# Patient Record
Sex: Female | Born: 1960 | Race: Black or African American | Hispanic: No | Marital: Single | Smoking: Current every day smoker
Health system: Southern US, Community
[De-identification: ages and names within clinical notes are randomized; demographics above are authoritative.]

## PROBLEM LIST (undated history)

## (undated) HISTORY — PX: ABDOMINAL HYSTERECTOMY: SHX81

---

## 2014-10-31 ENCOUNTER — Emergency Department (HOSPITAL_BASED_OUTPATIENT_CLINIC_OR_DEPARTMENT_OTHER)
Admission: EM | Admit: 2014-10-31 | Discharge: 2014-10-31 | Disposition: A | Discharge status: Home / Self Care | Payer: BLUE CROSS/BLUE SHIELD | Attending: Emergency Medicine | Admitting: Emergency Medicine

## 2014-10-31 ENCOUNTER — Encounter (HOSPITAL_BASED_OUTPATIENT_CLINIC_OR_DEPARTMENT_OTHER): Payer: Self-pay

## 2014-10-31 DIAGNOSIS — L03113 Cellulitis of right upper limb: Secondary | ICD-10-CM | POA: Diagnosis not present

## 2014-10-31 DIAGNOSIS — Z720 Tobacco use: Secondary | ICD-10-CM | POA: Diagnosis not present

## 2014-10-31 DIAGNOSIS — Z23 Encounter for immunization: Secondary | ICD-10-CM | POA: Insufficient documentation

## 2014-10-31 DIAGNOSIS — S40861A Insect bite (nonvenomous) of right upper arm, initial encounter: Secondary | ICD-10-CM | POA: Diagnosis not present

## 2014-10-31 DIAGNOSIS — Y92129 Unspecified place in nursing home as the place of occurrence of the external cause: Secondary | ICD-10-CM | POA: Insufficient documentation

## 2014-10-31 DIAGNOSIS — Y939 Activity, unspecified: Secondary | ICD-10-CM | POA: Diagnosis not present

## 2014-10-31 DIAGNOSIS — W57XXXA Bitten or stung by nonvenomous insect and other nonvenomous arthropods, initial encounter: Secondary | ICD-10-CM | POA: Diagnosis not present

## 2014-10-31 DIAGNOSIS — Y999 Unspecified external cause status: Secondary | ICD-10-CM | POA: Diagnosis not present

## 2014-10-31 MED ORDER — IBUPROFEN 800 MG PO TABS
800.0000 mg | ORAL_TABLET | Freq: Once | ORAL | Status: AC
  Administered 2014-10-31: 800 mg via ORAL
  Filled 2014-10-31: qty 1

## 2014-10-31 MED ORDER — CLINDAMYCIN HCL 150 MG PO CAPS
300.0000 mg | ORAL_CAPSULE | Freq: Once | ORAL | Status: AC
  Administered 2014-10-31: 300 mg via ORAL
  Filled 2014-10-31: qty 2

## 2014-10-31 MED ORDER — HYDROCODONE-ACETAMINOPHEN 5-325 MG PO TABS
2.0000 | ORAL_TABLET | Freq: Once | ORAL | Status: AC
  Administered 2014-10-31: 2 via ORAL
  Filled 2014-10-31: qty 2

## 2014-10-31 MED ORDER — TETANUS-DIPHTH-ACELL PERTUSSIS 5-2.5-18.5 LF-MCG/0.5 IM SUSP
0.5000 mL | Freq: Once | INTRAMUSCULAR | Status: AC
  Administered 2014-10-31: 0.5 mL via INTRAMUSCULAR
  Filled 2014-10-31: qty 0.5

## 2014-10-31 MED ORDER — IBUPROFEN 600 MG PO TABS: 600.0000 mg | ORAL_TABLET | Freq: Four times a day (QID) | ORAL | Status: AC | PRN

## 2014-10-31 MED ORDER — HYDROCODONE-ACETAMINOPHEN 5-325 MG PO TABS: 1.0000 | ORAL_TABLET | Freq: Four times a day (QID) | ORAL | Status: AC | PRN

## 2014-10-31 MED ORDER — CLINDAMYCIN HCL 300 MG PO CAPS: 300.0000 mg | ORAL_CAPSULE | Freq: Three times a day (TID) | ORAL | Status: AC

## 2014-10-31 NOTE — ED Provider Notes (Signed)
CSN: [id number]     Arrival date & time 10/31/14  1802 History  This chart was scribed for Richardean Canal, MD by Lyndel Safe, ED Scribe. This patient was seen in room MH03/MH03 and the patient's care was started 6:46 PM.   Chief Complaint  Patient presents with  . Arm Swelling   HPI HPI Comments: Alexis Oconnor is a 54 y.o. female who presents to the Emergency Department complaining of a progressively worsening area of constant, moderate pain, swelling, redness, and warmth to upper right arm s/p insect bite 1 day ago. 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 The pt states the area started as a pruritic area but has since worsened with increased swelling, redness, and warmth. She denies the affected area to be attributable to a needle stick. Denies fevers.    History reviewed. No pertinent past medical history. Past Surgical History  Procedure Laterality Date  . Cesarean section    . Abdominal hysterectomy     No family history on file. History  Substance Use Topics  . Smoking status: Current Every Day Smoker    Types: Cigarettes  . Smokeless tobacco: Not on file  . Alcohol Use: No   OB History    No data available     Review of Systems  Constitutional: Negative for fever.  Musculoskeletal: Positive for myalgias.  Skin: Positive for color change ( right upper arm; redness).  All other systems reviewed and are negative.  Allergies  Review of patient's allergies indicates no known allergies.  Home Medications   Prior to Admission medications   Not on File   BP 145/68 mmHg  Pulse 92  Temp(Src) 98.2 F (36.8 C) (Oral)  Resp 16  Ht  (1.575 m)  Wt 148 lb (67.132 kg)  BMI 27.06 kg/m2  SpO2 97% Physical Exam  Constitutional: She appears well-developed and well-nourished. No distress.  HENT:  Head: Normocephalic.  Eyes: Conjunctivae are normal. Right eye exhibits no discharge. Left eye  exhibits no discharge. No scleral icterus.  Neck: No JVD present.  Pulmonary/Chest: Effort normal. No respiratory distress.  Musculoskeletal: She exhibits edema.  Right bicep area with an area of cellulitis but no fluctuance. There are no swollen lymph nodes to the antecubital area. 2+ radial pulses.   Neurological: She is alert. Coordination normal.  Skin: Skin is warm. No rash noted. No erythema. No pallor.  Psychiatric: She has a normal mood and affect. Her behavior is normal.  Nursing note and vitals reviewed.   ED Course  Procedures  DIAGNOSTIC STUDIES: Oxygen Saturation is 97% on RA, normal by my interpretation.    COORDINATION OF CARE: 6:49 PM Discussed treatment plan which includes to order ibuprofen, pain medication, and clindamycin with pt. Pt acknowledges and agrees to plan.   Labs Review Labs Reviewed - No data to display  Imaging Review No results found.   EKG Interpretation None      MDM   Final diagnoses:  None   Alexis Oconnor is a 54 y.o. female here with possible bug bite. There is evidence of cellulitis, no abscess. Its about 4 inch in diameter. No lymphadenopathy. Afebrile, well appearing. Will dc with clinda, vicodin for pain. Gave strict return precautions.   I personally performed the services described in this documentation, which was scribed in my presence. The recorded information has been reviewed and is accurate.    Richardean Canal,  MD 10/31/14 1910

## 2014-10-31 NOTE — ED Notes (Signed)
Redness, swelling to right arm x today

## 2014-10-31 NOTE — ED Notes (Addendum)
Pt bit on Right arm by something at work last night. Patient RUE swollen, red, warm to touch. Denies any fever, n/v. Strong radial pulse

## 2014-10-31 NOTE — Discharge Instructions (Signed)
Take motrin for pain.   Take vicodin for severe pain. Do NOT drive with it.   Apply ice to area.   Take clinda three times daily for a week.   Follow up with your doctor.   Return to ER if you have severe pain, worse arm swelling, fever, purulent drainage from the wound.

## 2014-11-06 ENCOUNTER — Emergency Department (HOSPITAL_BASED_OUTPATIENT_CLINIC_OR_DEPARTMENT_OTHER)
Admission: EM | Admit: 2014-11-06 | Discharge: 2014-11-06 | Disposition: A | Discharge status: Home / Self Care | Payer: BLUE CROSS/BLUE SHIELD | Attending: Emergency Medicine | Admitting: Emergency Medicine

## 2014-11-06 ENCOUNTER — Encounter (HOSPITAL_BASED_OUTPATIENT_CLINIC_OR_DEPARTMENT_OTHER): Payer: Self-pay | Admitting: *Deleted

## 2014-11-06 DIAGNOSIS — Z720 Tobacco use: Secondary | ICD-10-CM | POA: Insufficient documentation

## 2014-11-06 DIAGNOSIS — M25511 Pain in right shoulder: Secondary | ICD-10-CM | POA: Insufficient documentation

## 2014-11-06 MED ORDER — IBUPROFEN 400 MG PO TABS
400.0000 mg | ORAL_TABLET | Freq: Once | ORAL | Status: AC
  Administered 2014-11-06: 400 mg via ORAL
  Filled 2014-11-06: qty 1

## 2014-11-06 MED ORDER — METHOCARBAMOL 500 MG PO TABS: 500.0000 mg | ORAL_TABLET | Freq: Two times a day (BID) | ORAL | Status: AC

## 2014-11-06 NOTE — ED Notes (Signed)
Presents with rt arm pain and tenderness after rec Tetanus injection

## 2014-11-06 NOTE — ED Notes (Signed)
MD at bedside. 

## 2014-11-06 NOTE — Discharge Instructions (Signed)
Continue taking the Motrin as directed. Use the Robaxin as directed as well 2. Return here for fever, arm swelling, and tingling, or any other problems

## 2014-11-06 NOTE — ED Notes (Signed)
Pt state was here, this past Monday, rec injection in rt arm, tetanus injection secondary to a bite

## 2014-11-06 NOTE — ED Provider Notes (Signed)
CSN: [id number]     Arrival date & time 11/06/14  1026 History   First MD Initiated Contact with Patient 11/06/14 1035     Chief Complaint  Patient presents with  . Arm Pain     (Consider location/radiation/quality/duration/timing/severity/associated sxs/prior Treatment) HPI Comments: Patient here complaining of right shoulder pain at the injection site where she had a tetanus update recently. Denies any fever or chills at home. Pain characterized as mild and worse with movement. Denies any numbness or tingling to her right hand. No recent history of trauma. She is right-handed. Pain is worse with movement and better with remaining still. No treatment use prior to arrival  Patient is a 54 y.o. female presenting with arm pain. The history is provided by the patient.  Arm Pain    History reviewed. No pertinent past medical history. Past Surgical History  Procedure Laterality Date  . Cesarean section    . Abdominal hysterectomy     No family history on file. Social History  Substance Use Topics  . Smoking status: Current Every Day Smoker    Types: Cigarettes  . Smokeless tobacco: None  . Alcohol Use: No   OB History    No data available     Review of Systems  All other systems reviewed and are negative.     Allergies  Review of patient's allergies indicates no known allergies.  Home Medications   Prior to Admission medications   Medication Sig Start Date End Date Taking? Authorizing Provider  clindamycin (CLEOCIN) 300 MG capsule Take 1 capsule (300 mg total) by mouth 3 (three) times daily. X 7 days 10/31/14   Richardean Canal, MD  HYDROcodone-acetaminophen (NORCO/VICODIN) 5-325 MG per tablet Take 1-2 tablets by mouth every 6 (six) hours as needed. 10/31/14   Richardean Canal, MD  ibuprofen (ADVIL,MOTRIN) 600 MG tablet Take 1 tablet (600 mg total) by mouth every 6 (six) hours as needed. 10/31/14   Richardean Canal, MD   BP 136/61 mmHg  Pulse 100  Temp(Src) 99.5 F (37.5 C) (Oral)  Resp  20  SpO2 96% Physical Exam  Constitutional: She is oriented to person, place, and time. She appears well-developed and well-nourished.  Non-toxic appearance. No distress.  HENT:  Head: Normocephalic and atraumatic.  Eyes: Conjunctivae, EOM and lids are normal. Pupils are equal, round, and reactive to light.  Neck: Normal range of motion. Neck supple. No tracheal deviation present. No thyroid mass present.  Cardiovascular: Normal rate, regular rhythm and normal heart sounds.  Exam reveals no gallop.   No murmur heard. Pulmonary/Chest: Effort normal and breath sounds normal. No stridor. No respiratory distress. She has no decreased breath sounds. She has no wheezes. She has no rhonchi. She has no rales.  Abdominal: Soft. Normal appearance and bowel sounds are normal. She exhibits no distension. There is no tenderness. There is no rebound and no CVA tenderness.  Musculoskeletal: Normal range of motion. She exhibits no edema or tenderness.       Arms: Neurological: She is alert and oriented to person, place, and time. She has normal strength. No cranial nerve deficit or sensory deficit. GCS eye subscore is 4. GCS verbal subscore is 5. GCS motor subscore is 6.  Skin: Skin is warm and dry. No abrasion and no rash noted.  Psychiatric: She has a normal mood and affect. Her speech is normal and behavior is normal.  Nursing note and vitals reviewed.   ED Course  Procedures (including critical care time)  Labs Review Labs Reviewed - No data to display  Imaging Review No results found. I, Toy Baker, personally reviewed and evaluated these images and lab results as part of my medical decision-making.   EKG Interpretation None      MDM   Final diagnoses:  None    No evidence of infection outpatient shoulder. Suspect this is a reaction from being immunized. Given Motrin here return precautions given    Lorre Nick, MD 11/06/14 1045

## 2014-12-27 ENCOUNTER — Encounter (HOSPITAL_BASED_OUTPATIENT_CLINIC_OR_DEPARTMENT_OTHER): Payer: Self-pay

## 2014-12-27 ENCOUNTER — Emergency Department (HOSPITAL_BASED_OUTPATIENT_CLINIC_OR_DEPARTMENT_OTHER)
Admission: EM | Admit: 2014-12-27 | Discharge: 2014-12-27 | Disposition: A | Discharge status: Home / Self Care | Payer: BLUE CROSS/BLUE SHIELD | Attending: Emergency Medicine | Admitting: Emergency Medicine

## 2014-12-27 ENCOUNTER — Emergency Department (HOSPITAL_BASED_OUTPATIENT_CLINIC_OR_DEPARTMENT_OTHER): Payer: BLUE CROSS/BLUE SHIELD

## 2014-12-27 DIAGNOSIS — N390 Urinary tract infection, site not specified: Secondary | ICD-10-CM | POA: Diagnosis not present

## 2014-12-27 DIAGNOSIS — G43809 Other migraine, not intractable, without status migrainosus: Secondary | ICD-10-CM | POA: Diagnosis not present

## 2014-12-27 DIAGNOSIS — Z79899 Other long term (current) drug therapy: Secondary | ICD-10-CM | POA: Diagnosis not present

## 2014-12-27 DIAGNOSIS — Z720 Tobacco use: Secondary | ICD-10-CM | POA: Insufficient documentation

## 2014-12-27 DIAGNOSIS — M542 Cervicalgia: Secondary | ICD-10-CM | POA: Insufficient documentation

## 2014-12-27 DIAGNOSIS — M549 Dorsalgia, unspecified: Secondary | ICD-10-CM | POA: Diagnosis present

## 2014-12-27 LAB — URINE MICROSCOPIC-ADD ON

## 2014-12-27 LAB — URINALYSIS, ROUTINE W REFLEX MICROSCOPIC
Bilirubin Urine: NEGATIVE
Glucose, UA: NEGATIVE mg/dL
HGB URINE DIPSTICK: NEGATIVE
KETONES UR: NEGATIVE mg/dL
Nitrite: NEGATIVE
PROTEIN: NEGATIVE mg/dL
Specific Gravity, Urine: 1.018 (ref 1.005–1.030)
Urobilinogen, UA: 1 mg/dL (ref 0.0–1.0)
pH: 5.5 (ref 5.0–8.0)

## 2014-12-27 MED ORDER — FOSFOMYCIN TROMETHAMINE 3 G PO PACK
3.0000 g | PACK | Freq: Once | ORAL | Status: AC
  Administered 2014-12-27: 3 g via ORAL
  Filled 2014-12-27: qty 3

## 2014-12-27 MED ORDER — SODIUM CHLORIDE 0.9 % IV BOLUS (SEPSIS)
1000.0000 mL | Freq: Once | INTRAVENOUS | Status: AC
  Administered 2014-12-27: 1000 mL via INTRAVENOUS

## 2014-12-27 MED ORDER — KETOROLAC TROMETHAMINE 30 MG/ML IJ SOLN
30.0000 mg | Freq: Once | INTRAMUSCULAR | Status: AC
  Administered 2014-12-27: 30 mg via INTRAVENOUS
  Filled 2014-12-27: qty 1

## 2014-12-27 MED ORDER — PROCHLORPERAZINE EDISYLATE 5 MG/ML IJ SOLN
10.0000 mg | Freq: Once | INTRAMUSCULAR | Status: AC
  Administered 2014-12-27: 10 mg via INTRAVENOUS
  Filled 2014-12-27: qty 2

## 2014-12-27 MED ORDER — DIPHENHYDRAMINE HCL 50 MG/ML IJ SOLN
25.0000 mg | Freq: Once | INTRAMUSCULAR | Status: AC
  Administered 2014-12-27: 25 mg via INTRAVENOUS
  Filled 2014-12-27: qty 1

## 2014-12-27 NOTE — ED Notes (Signed)
Pt c/o right side back pain that radiates down her leg and a headache since last night that is not relieved by one dose of aleve.  C/o eye pain associated with headache, denies n/v.  Denies numbness to groin, denies incontinence, denies injury.

## 2014-12-27 NOTE — ED Provider Notes (Signed)
CSN: [id number]     Arrival date & time 12/27/14  4098 History   First MD Initiated Contact with Patient 12/27/14 [phone]     Chief Complaint  Patient presents with  . Headache  . Back Pain     (Consider location/radiation/quality/duration/timing/severity/associated sxs/prior Treatment) Patient is a 54 y.o. female presenting with headaches and back pain. The history is provided by the patient.  Headache Pain location:  Occipital Quality:  Dull Radiates to:  Does not radiate Severity currently:  10/10 Severity at highest:  10/10 Onset quality:  Gradual Duration:  2 days Timing:  Constant Progression:  Worsening Chronicity:  Recurrent Similar to prior headaches: yes   Context: activity, bright light and loud noise   Relieved by:  Nothing Worsened by:  Nothing Ineffective treatments:  None tried Associated symptoms: back pain and neck pain   Associated symptoms: no abdominal pain, no congestion, no dizziness, no fever, no myalgias, no nausea, no neck stiffness and no vomiting   Back Pain Associated symptoms: dysuria   Associated symptoms: no abdominal pain, no chest pain, no fever and no headaches    54 yo F with a chief complaint of a migraine headache. Patient states is been going on since yesterday. States that she also has had generalized myalgias. He is also started yesterday. Denies cough congestion fevers chills. Has had some mild right-sided earache as well as some mild right eye injection. Mild dysuria. Patient works as a International aid/development worker. Denies any recent injuries. Complaining of pain worse in the trapezius muscles bilaterally. Headache is like prior. Slow in onset.  History reviewed. No pertinent past medical history. Past Surgical History  Procedure Laterality Date  . Cesarean section    . Abdominal hysterectomy     No family history on file. Social History  Substance Use Topics  . Smoking status: Current Every Day Smoker    Types: Cigarettes  . Smokeless tobacco:  None  . Alcohol Use: No   OB History    No data available     Review of Systems  Constitutional: Negative for fever and chills.  HENT: Negative for congestion and rhinorrhea.   Eyes: Negative for redness and visual disturbance.  Respiratory: Negative for shortness of breath and wheezing.   Cardiovascular: Negative for chest pain and palpitations.  Gastrointestinal: Negative for nausea, vomiting and abdominal pain.  Genitourinary: Positive for dysuria. Negative for urgency.  Musculoskeletal: Positive for back pain and neck pain. Negative for myalgias, arthralgias and neck stiffness.  Skin: Negative for pallor and wound.  Neurological: Negative for dizziness and headaches.      Allergies  Review of patient's allergies indicates no known allergies.  Home Medications   Prior to Admission medications   Medication Sig Start Date End Date Taking? Authorizing Provider  clindamycin (CLEOCIN) 300 MG capsule Take 1 capsule (300 mg total) by mouth 3 (three) times daily. X 7 days 10/31/14   Richardean Canal, MD  HYDROcodone-acetaminophen (NORCO/VICODIN) 5-325 MG per tablet Take 1-2 tablets by mouth every 6 (six) hours as needed. 10/31/14   Richardean Canal, MD  ibuprofen (ADVIL,MOTRIN) 600 MG tablet Take 1 tablet (600 mg total) by mouth every 6 (six) hours as needed. 10/31/14   Richardean Canal, MD  methocarbamol (ROBAXIN) 500 MG tablet Take 1 tablet (500 mg total) by mouth 2 (two) times daily. 11/06/14   Lorre Nick, MD   BP 101/80 mmHg  Pulse 76  Temp(Src) 97.9 F (36.6 C) (Oral)  Resp 18  Ht  (1.575 m)  Wt 147 lb (66.679 kg)  BMI 26.88 kg/m2  SpO2 98% Physical Exam  Constitutional: She is oriented to person, place, and time. She appears well-developed and well-nourished. No distress.  HENT:  Head: Normocephalic and atraumatic.  Eyes: EOM are normal. Pupils are equal, round, and reactive to light.  Neck: Normal range of motion. Neck supple.  Cardiovascular: Normal rate and regular rhythm.   Exam reveals no gallop and no friction rub.   No murmur heard. Pulmonary/Chest: Effort normal. She has no wheezes. She has no rales.  Abdominal: Soft. She exhibits no distension. There is no tenderness. There is no rebound and no guarding.  Musculoskeletal: She exhibits no edema or tenderness.  Neurological: She is alert and oriented to person, place, and time. She has normal strength. No cranial nerve deficit or sensory deficit. She displays a negative Romberg sign. Coordination and gait normal. GCS eye subscore is 4. GCS verbal subscore is 5. GCS motor subscore is 6. She displays no Babinski's sign on the right side. She displays no Babinski's sign on the left side.  Reflex Scores:      Tricep reflexes are 2+ on the right side and 2+ on the left side.      Bicep reflexes are 2+ on the right side and 2+ on the left side.      Brachioradialis reflexes are 2+ on the right side and 2+ on the left side.      Patellar reflexes are 2+ on the right side and 2+ on the left side.      Achilles reflexes are 2+ on the right side and 2+ on the left side. Skin: Skin is warm and dry. She is not diaphoretic.  Psychiatric: She has a normal mood and affect. Her behavior is normal.    ED Course  Procedures (including critical care time) Labs Review Labs Reviewed  URINALYSIS, ROUTINE W REFLEX MICROSCOPIC (NOT AT [facility]) - Abnormal; Notable for the following:    APPearance CLOUDY (*)    Leukocytes, UA SMALL (*)    All other components within normal limits  URINE MICROSCOPIC-ADD ON - Abnormal; Notable for the following:    Squamous Epithelial / LPF FEW (*)    Bacteria, UA MANY (*)    All other components within normal limits  URINE CULTURE    Imaging Review Dg Chest 2 View  12/27/2014   CLINICAL DATA:  Shortness of breath and congestion for 4 days  EXAM: CHEST  2 VIEW  COMPARISON:  None.  FINDINGS: Lungs are clear. The heart size and pulmonary vascularity are normal. No adenopathy. There is thoracolumbar  levoscoliosis.  IMPRESSION: No edema or consolidation.   Electronically Signed   By: Bretta Bang III M.D.   On: 12/27/2014 07:28   I have personally reviewed and evaluated these images and lab results as part of my medical decision-making.   EKG Interpretation None      MDM   Final diagnoses:  UTI (lower urinary tract infection)  Other migraine without status migrainosus, not intractable    54 yo F with a chief complaint of generalized myalgias and a migraine headache. Headache appears unchanged from prior headaches. Will treat with migraine cocktail. Patient also complaining of generalized myalgias will obtain a urine chest x-ray to rule out bacterial source of infection. Could be viral in etiology versus simple muscle strain of the trapezius. Patient unsure of any inciting event to cause muscle injury.  Headache completely relieved with  migraine cocktail. Patient feeling much better requesting discharge home. UA with possible UTI with too numerous to count bacteria. Will treat one time with fosfomycin. Sent for culture. PCP follow-up.  8:31 AM:  I have discussed the diagnosis/risks/treatment options with the patient and believe the pt to be eligible for discharge home to follow-up with PCP. We also discussed returning to the ED immediately if new or worsening sx occur. We discussed the sx which are most concerning (e.g., fevers, chills, back pain, neuro symptoms) that necessitate immediate return. Medications administered to the patient during their visit and any new prescriptions provided to the patient are listed below.  Medications given during this visit Medications  prochlorperazine (COMPAZINE) injection 10 mg (10 mg Intravenous Given 12/27/14 0735)  ketorolac (TORADOL) 30 MG/ML injection 30 mg (30 mg Intravenous Given 12/27/14 0735)  diphenhydrAMINE (BENADRYL) injection 25 mg (25 mg Intravenous Given 12/27/14 0735)  sodium chloride 0.9 % bolus 1,000 mL (1,000 mLs Intravenous New  Bag/Given 12/27/14 0735)  fosfomycin (MONUROL) packet 3 g (3 g Oral Given 12/27/14 0822)    New Prescriptions   No medications on file     The patient appears reasonably screen and/or stabilized for discharge and I doubt any other medical condition or other [facility] requiring further screening, evaluation, or treatment in the ED at this time prior to discharge.    Melene Plan, DO 12/27/14 [phone]

## 2014-12-27 NOTE — Discharge Instructions (Signed)

## 2014-12-27 NOTE — ED Notes (Signed)
Pt falls asleep easily, snoring respirations

## 2014-12-27 NOTE — ED Notes (Signed)
Pt waiting for her daughter to come pick her up to take her home

## 2014-12-28 LAB — URINE CULTURE
Culture: >=100000
Special Requests: NORMAL

## 2014-12-29 ENCOUNTER — Telehealth (HOSPITAL_BASED_OUTPATIENT_CLINIC_OR_DEPARTMENT_OTHER): Payer: Self-pay | Admitting: Emergency Medicine

## 2014-12-29 NOTE — Progress Notes (Signed)
ED Antimicrobial Stewardship Positive Culture Follow Up   Alexis Oconnor is an 54 y.o. female who presented to [facility] on 12/27/2014 with a chief complaint of  Chief Complaint  Patient presents with  . Headache  . Back Pain    Recent Results (from the past 720 hour(s))  Urine culture     Status: None   Collection Time: 12/27/14  7:45 AM  Result Value Ref Range Status   Specimen Description URINE, CLEAN CATCH  Final   Special Requests Normal  Final   Culture   Final    >=100,000 COLONIES/mL GROUP B STREP(S.AGALACTIAE)ISOLATED TESTING AGAINST S. AGALACTIAE NOT ROUTINELY PERFORMED DUE TO PREDICTABILITY OF AMP/PEN/VAN SUSCEPTIBILITY. Performed at [facility]    Report Status 12/28/2014 FINAL  Final     Treated with fosfomycin x1 in ED, no data for organism coverage with this abx  New antibiotic prescription: If patient still symptomatic, Keflex  bid x 7day  ED Provider: Donnetta Hutching, MD  Assessment: 54yoF came to the ED with complaints of HA, back pain, and dysuria. Treated with fosfomycin x 1 dose in ED. No discharged abx. UCx grew Group B strep. Coverage of organism with fosfomycin is not known. Plan to contact patient and see if still symptomatic, if so then prescribe Keflex  bid x 7day  Remi Haggard, PharmD Clinical Pharmacist- Resident Pager: [phone]  Remi Haggard 12/29/2014, 9:14 AM Infectious Diseases Pharmacist Phone# [phone]

## 2014-12-29 NOTE — Telephone Encounter (Signed)
Post ED Visit - Positive Culture Follow-up: Successful Patient Follow-Up  Culture assessed and recommendations reviewed by:  Celedonio Miyamoto, Pharm.D., BCPS-AQ ID  Georgina Pillion, [address].D., BCPS  [place], [place].D., BCPS, AAHIVP  Estella Husk, Pharm.D., BCPS, AAHIVP  [employer], [address].D.  Tennis Must, [place].D. Lisette Grinder PharmD  Positive urine culture Group B strep   Patient discharged without antimicrobial prescription and treatment is now indicated  Organism is resistant to prescribed ED discharge antimicrobial  Patient with positive blood cultures  Changes discussed with ED provider: Donnetta Hutching New antibiotic prescription Keflex  po q 12 hours x 7 days is symptomatic Called to n/a at present attemptting to reach pt Contacted patient,, left message for pt to call   Berle Mull 12/29/2014, 4:02 PM

## 2014-12-29 NOTE — Telephone Encounter (Deleted)
Post ED Visit - Positive Culture Follow-up  Culture report reviewed by antimicrobial stewardship pharmacist:   Celedonio Miyamoto, Pharm.D., BCPS  Georgina Pillion, [address].D., BCPS  [place], [place].D., BCPS, AAHIVP  Estella Husk, Pharm.D., BCPS, AAHIVP  [employer], [address].D.  Cassie Roseanne [place], [place].D. Lisette Grinder PharmD  Positive urine Culture Group B strep Treated with fosp, organism sensitive to the same and no further patient follow-up is required at this time.  Berle Mull 12/29/2014, 4:00 PM

## 2014-12-30 ENCOUNTER — Telehealth (HOSPITAL_COMMUNITY): Payer: Self-pay

## 2014-12-30 NOTE — Telephone Encounter (Signed)
Left message w/pts daughter for pt to return call.

## 2014-12-31 ENCOUNTER — Telehealth (HOSPITAL_COMMUNITY): Payer: Self-pay

## 2014-12-31 NOTE — Telephone Encounter (Signed)
Unable to reach by telephone. Letter sent to address on record.  

## 2015-01-12 ENCOUNTER — Telehealth (HOSPITAL_BASED_OUTPATIENT_CLINIC_OR_DEPARTMENT_OTHER): Payer: Self-pay | Admitting: Emergency Medicine

## 2015-01-12 NOTE — Telephone Encounter (Signed)
Post ED Visit - Positive Culture Follow-up: Successful Patient Follow-Up  Culture assessed and recommendations reviewed by: []  Celedonio MiyamotoJeremy Frens, Pharm.D., BCPS-AQ ID []  Georgina PillionElizabeth Martin, Pharm.D., BCPS []  IndustryMinh Pham, [address]D., BCPS, AAHIVP []  Estella HuskMichelle Turner, Pharm.D., BCPS, AAHIVP []  [employer] Pal[employer]iveCristy Reyes, [address] []  Cassie [place], [place]D. Sandi CarneNick Gazda PharmD  Positive culture Urine group b strep  []  Patient discharged without antimicrobial prescription and treatment is now indicated [x]  Organism is resistant to prescribed ED discharge antimicrobial []  Patient with positive blood cultures  Changes discussed with ED provider: Donnetta HutchingBrian Cook MD New antibiotic prescription Keflex 500mg  po q 12 hours x 7 days Called to [facility] [place] @ [phone]  Contacted patient, 01/12/15 1651   Berle MullMiller, Marjan Rosman 01/12/2015, 4:49 PM

## 2016-08-19 IMAGING — DX DG CHEST 2V
2 series · 2 of 2 positions shown · non-contrast
Comparison: None.

CLINICAL DATA: Shortness of breath and congestion for 4 days

EXAM:
CHEST  2 VIEW

[chest pa]
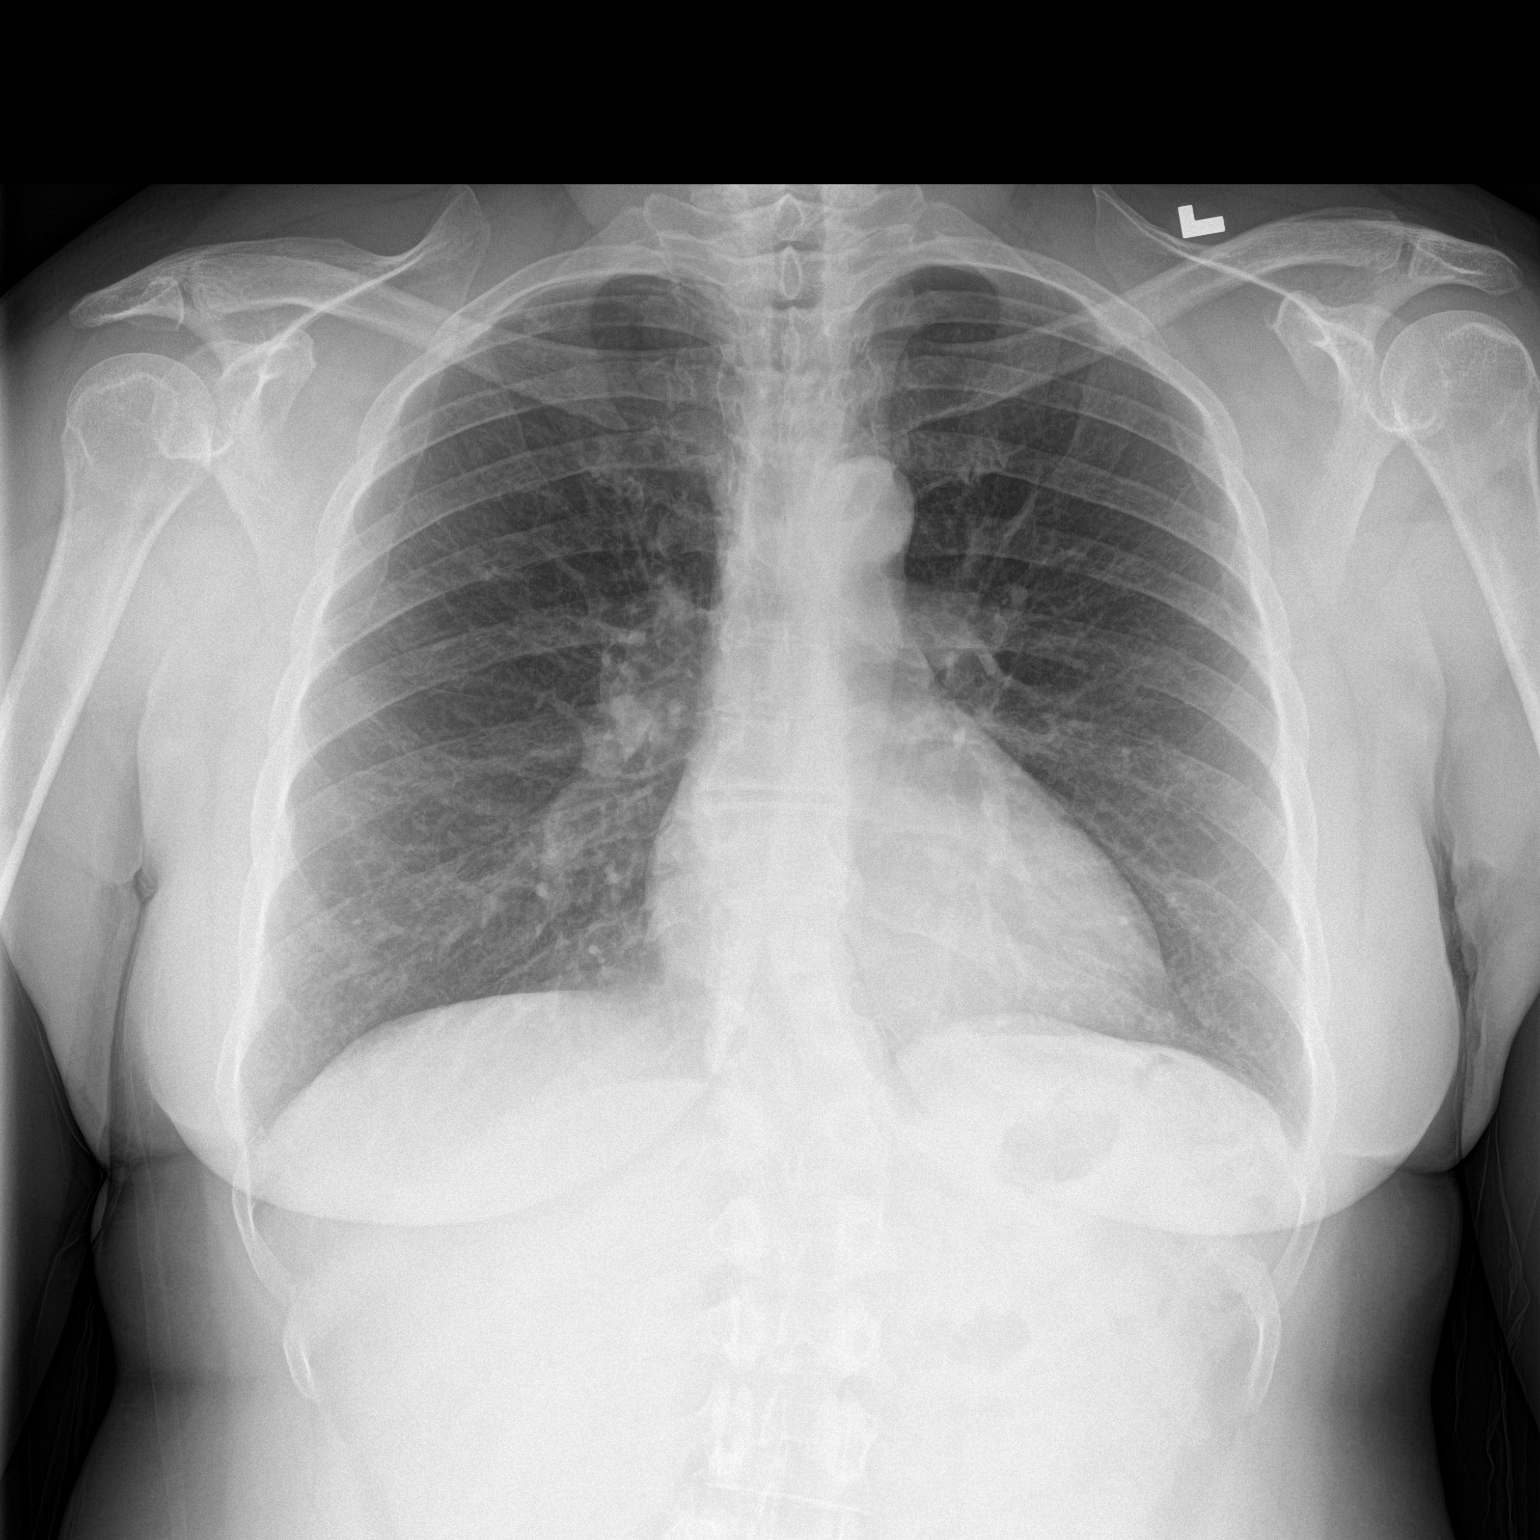

[chest lat]
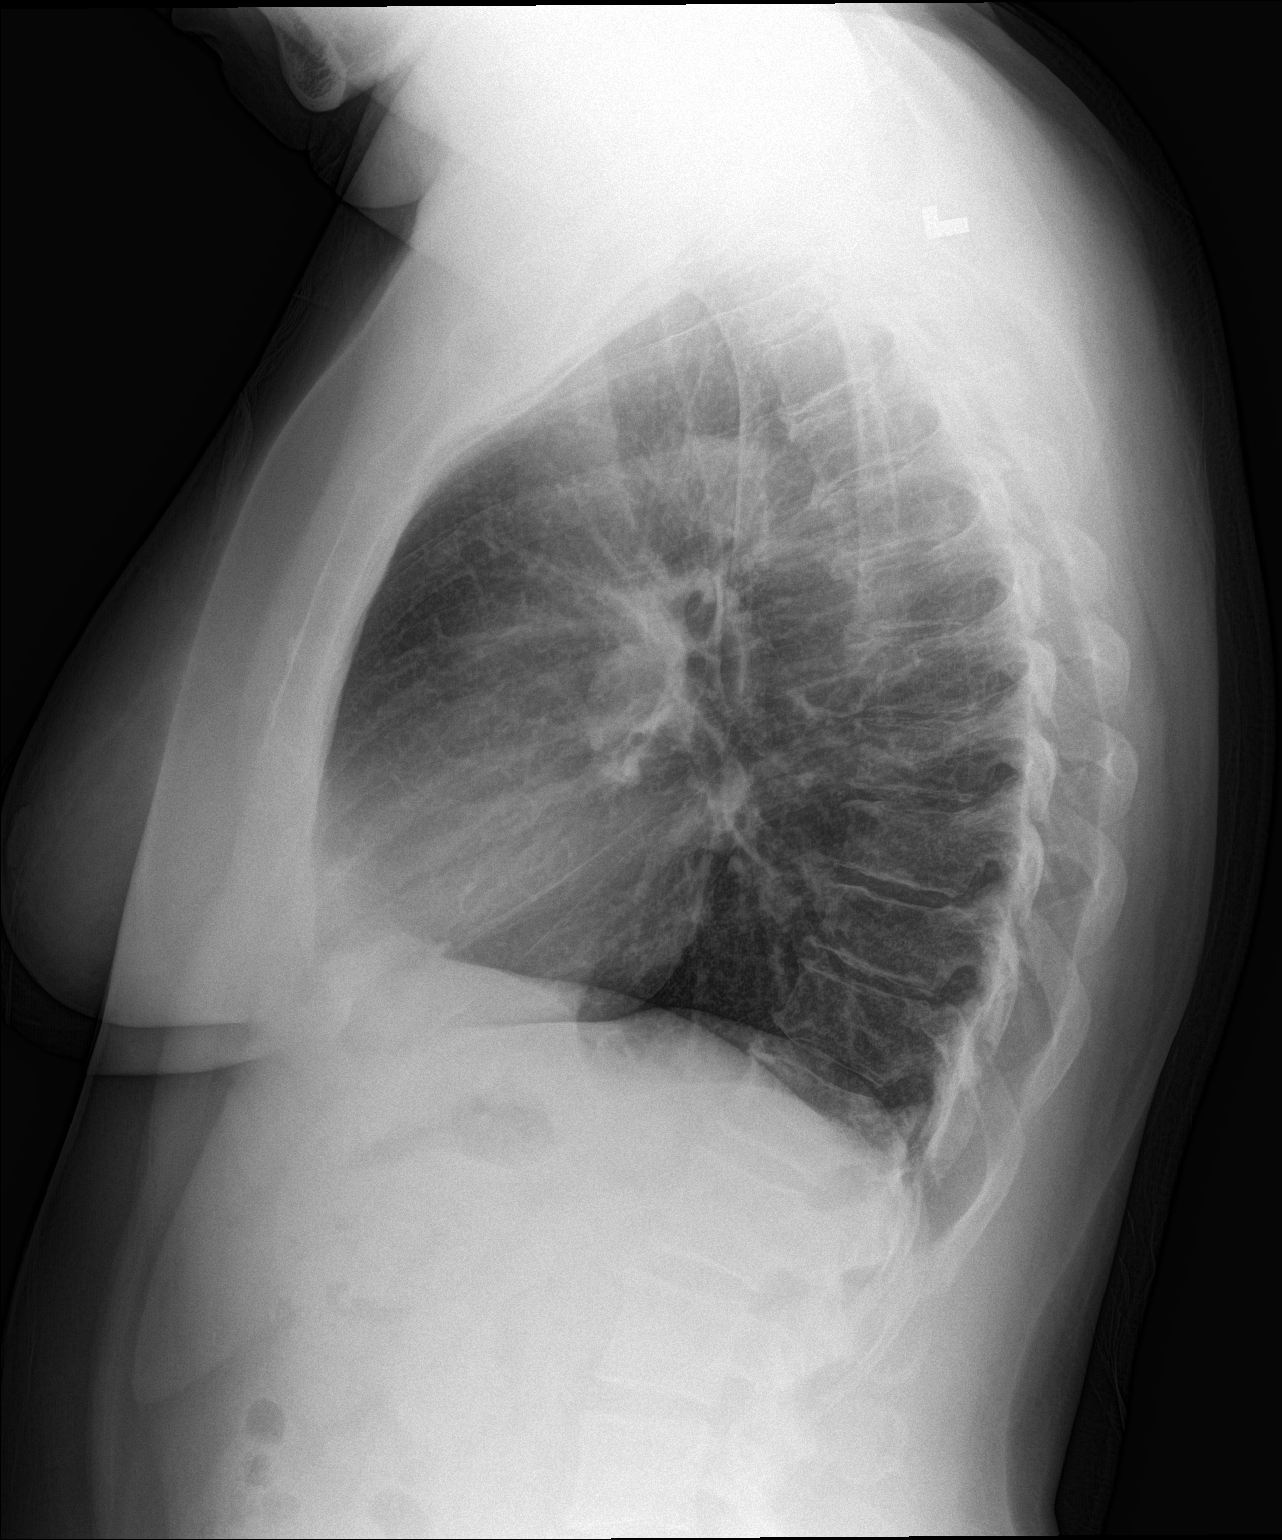

[2 of 2 positions shown; findings below may reference images not displayed]

FINDINGS: Lungs are clear. The heart size and pulmonary vascularity are
normal. No adenopathy. There is thoracolumbar levoscoliosis.
IMPRESSION: No edema or consolidation.
